# Patient Record
Sex: Female | Born: 1985 | Race: Black or African American | Hispanic: No | Marital: Single | Smoking: Never smoker
Health system: Southern US, Community
[De-identification: ages and names within clinical notes are randomized; demographics above are authoritative.]

## PROBLEM LIST (undated history)

## (undated) DIAGNOSIS — D649 Anemia, unspecified: Secondary | ICD-10-CM

## (undated) HISTORY — PX: LAPAROSCOPIC GASTRIC SLEEVE RESECTION: SHX5895

---

## 2005-05-19 ENCOUNTER — Other Ambulatory Visit: Admission: RE | Admit: 2005-05-19 | Discharge: 2005-05-19 | Payer: Self-pay | Admitting: Family Medicine

## 2005-05-19 ENCOUNTER — Ambulatory Visit: Payer: Self-pay | Admitting: Family Medicine

## 2006-01-12 ENCOUNTER — Ambulatory Visit: Payer: Self-pay | Admitting: Family Medicine

## 2007-05-08 ENCOUNTER — Emergency Department (HOSPITAL_COMMUNITY): Admission: EM | Admit: 2007-05-08 | Discharge: 2007-05-08 | Payer: Self-pay | Admitting: Emergency Medicine

## 2007-05-17 ENCOUNTER — Emergency Department (HOSPITAL_COMMUNITY): Admission: EM | Admit: 2007-05-17 | Discharge: 2007-05-18 | Payer: Self-pay | Admitting: Emergency Medicine

## 2007-05-20 ENCOUNTER — Inpatient Hospital Stay (HOSPITAL_COMMUNITY): Admission: AD | Admit: 2007-05-20 | Discharge: 2007-05-20 | Payer: Self-pay | Admitting: Obstetrics & Gynecology

## 2007-05-26 ENCOUNTER — Inpatient Hospital Stay (HOSPITAL_COMMUNITY): Admission: AD | Admit: 2007-05-26 | Discharge: 2007-05-26 | Payer: Self-pay | Admitting: Obstetrics and Gynecology

## 2007-09-11 ENCOUNTER — Emergency Department (HOSPITAL_COMMUNITY): Admission: EM | Admit: 2007-09-11 | Discharge: 2007-09-11 | Payer: Self-pay | Admitting: Emergency Medicine

## 2008-08-13 ENCOUNTER — Ambulatory Visit: Payer: Self-pay | Admitting: Internal Medicine

## 2008-09-17 ENCOUNTER — Ambulatory Visit: Payer: Self-pay | Admitting: *Deleted

## 2008-09-17 ENCOUNTER — Encounter (INDEPENDENT_AMBULATORY_CARE_PROVIDER_SITE_OTHER): Payer: Self-pay | Admitting: Family Medicine

## 2008-09-17 ENCOUNTER — Ambulatory Visit: Payer: Self-pay | Admitting: Family Medicine

## 2008-09-17 LAB — CONVERTED CEMR LAB
Chlamydia, DNA Probe: NEGATIVE
GC Probe Amp, Genital: NEGATIVE

## 2008-09-21 ENCOUNTER — Ambulatory Visit (HOSPITAL_COMMUNITY): Admission: RE | Admit: 2008-09-21 | Discharge: 2008-09-21 | Payer: Self-pay | Admitting: Family Medicine

## 2008-10-21 ENCOUNTER — Encounter: Admission: RE | Admit: 2008-10-21 | Discharge: 2008-11-17 | Payer: Self-pay | Admitting: Family Medicine

## 2008-11-23 ENCOUNTER — Encounter: Admission: RE | Admit: 2008-11-23 | Discharge: 2008-12-22 | Payer: Self-pay | Admitting: Family Medicine

## 2009-02-09 ENCOUNTER — Encounter: Admission: RE | Admit: 2009-02-09 | Discharge: 2009-04-01 | Payer: Self-pay | Admitting: Family Medicine

## 2009-02-10 ENCOUNTER — Emergency Department (HOSPITAL_COMMUNITY): Admission: EM | Admit: 2009-02-10 | Discharge: 2009-02-10 | Payer: Self-pay | Admitting: Emergency Medicine

## 2010-12-11 ENCOUNTER — Encounter: Payer: Self-pay | Admitting: Sports Medicine

## 2011-09-05 LAB — URINALYSIS, ROUTINE W REFLEX MICROSCOPIC
Glucose, UA: NEGATIVE
Protein, ur: 100 — AB
Specific Gravity, Urine: >1.03 — ABNORMAL HIGH
pH: 6

## 2011-09-06 LAB — URINALYSIS, ROUTINE W REFLEX MICROSCOPIC
Bilirubin Urine: NEGATIVE
Glucose, UA: 100 — AB
Glucose, UA: NEGATIVE
Glucose, UA: NEGATIVE
Ketones, ur: >80 — AB
Ketones, ur: NEGATIVE
Nitrite: NEGATIVE
Nitrite: NEGATIVE
Protein, ur: NEGATIVE
Specific Gravity, Urine: 1.025
Specific Gravity, Urine: 1.037 — ABNORMAL HIGH
Urobilinogen, UA: 1

## 2011-09-06 LAB — URINE MICROSCOPIC-ADD ON

## 2011-09-06 LAB — COMPREHENSIVE METABOLIC PANEL
ALT: 16
AST: 16
Alkaline Phosphatase: 47
CO2: 18 — ABNORMAL LOW
Calcium: 8.9
Creatinine, Ser: 0.67
GFR calc Af Amer: >60
GFR calc Af Amer: >60
GFR calc non Af Amer: >60
Glucose, Bld: 84
Glucose, Bld: 90
Sodium: 136
Sodium: 139
Total Bilirubin: 1.2
Total Protein: 6.6

## 2011-09-06 LAB — CBC
MCHC: 32.7
Platelets: 361
RDW: 17.2 — ABNORMAL HIGH
WBC: 4.2
WBC: 5.9

## 2011-09-06 LAB — DIFFERENTIAL
Basophils Absolute: 0
Basophils Relative: 0
Basophils Relative: 1
Eosinophils Absolute: 0
Eosinophils Absolute: 0
Lymphs Abs: 0.9
Monocytes Relative: 11
Monocytes Relative: 6

## 2011-09-06 LAB — GC/CHLAMYDIA PROBE AMP, GENITAL: GC Probe Amp, Genital: NEGATIVE

## 2011-09-06 LAB — POCT PREGNANCY, URINE
Operator id: 288831
Preg Test, Ur: POSITIVE

## 2011-09-06 LAB — WET PREP, GENITAL: Yeast Wet Prep HPF POC: NONE SEEN

## 2011-09-06 LAB — LIPASE, BLOOD: Lipase: 23

## 2011-09-06 LAB — HCG, QUANTITATIVE, PREGNANCY: hCG, Beta Chain, Quant, S: 17376 — ABNORMAL HIGH

## 2018-07-23 ENCOUNTER — Other Ambulatory Visit: Payer: Self-pay

## 2018-07-23 ENCOUNTER — Emergency Department (HOSPITAL_BASED_OUTPATIENT_CLINIC_OR_DEPARTMENT_OTHER)
Admission: EM | Admit: 2018-07-23 | Discharge: 2018-07-23 | Disposition: A | Discharge status: Home / Self Care | Payer: 59 | Attending: Emergency Medicine | Admitting: Emergency Medicine

## 2018-07-23 ENCOUNTER — Encounter (HOSPITAL_BASED_OUTPATIENT_CLINIC_OR_DEPARTMENT_OTHER): Payer: Self-pay

## 2018-07-23 DIAGNOSIS — Z3A01 Less than 8 weeks gestation of pregnancy: Secondary | ICD-10-CM | POA: Insufficient documentation

## 2018-07-23 DIAGNOSIS — O200 Threatened abortion: Secondary | ICD-10-CM

## 2018-07-23 DIAGNOSIS — O209 Hemorrhage in early pregnancy, unspecified: Secondary | ICD-10-CM | POA: Diagnosis present

## 2018-07-23 LAB — HCG, QUANTITATIVE, PREGNANCY: hCG, Beta Chain, Quant, S: 9161 m[IU]/mL — ABNORMAL HIGH (ref <5)

## 2018-07-23 NOTE — ED Notes (Signed)
Pt saw OBGYN Friday to comfirm pregnancy- pt reports having low but normal HCG count. Concerned for miscarriage. No hx of same.

## 2018-07-23 NOTE — ED Triage Notes (Addendum)
Pt states she is [redacted] weeks pregnant-vaginal bleeding with "clots and tissue" today-pad count 1 in place-NAD-steady gait

## 2018-07-23 NOTE — ED Provider Notes (Signed)
[facility] EMERGENCY DEPARTMENT Provider Note   CSN: [id number] Arrival date & time: 07/23/18  1219     History   Chief Complaint Chief Complaint  Patient presents with  . Threatened Miscarriage    HPI Michelle Glenn is a 32 y.o. female.  HPI   Blood, blood clot in tissue [facility] appt 9/17 6wk and 5 days by dates [place] was done Saturday at [place], intrauterine pregnancy Has had small amount of bleeding, had clot in tissue which was concerning. Since then no bleeding.  No abdominal pain. No lightheadedness  G3P2002 O POS   History reviewed. No pertinent past medical history.  There are no active problems to display for this patient.   History reviewed. No pertinent surgical history.   OB History    Gravida  1   Para      Term      Preterm      AB      Living        SAB      TAB      Ectopic      Multiple      Live Births               Home Medications    Prior to Admission medications   Not on File    Family History No family history on file.  Social History Social History   Tobacco Use  . Smoking status: Never Smoker  . Smokeless tobacco: Never Used  Substance Use Topics  . Alcohol use: Never    Frequency: Never  . Drug use: Never     Allergies   Patient has no known allergies.   Review of Systems Review of Systems  Constitutional: Negative for fever.  HENT: Negative for sore throat.   Eyes: Negative for visual disturbance.  Respiratory: Negative for cough and shortness of breath.   Cardiovascular: Negative for chest pain.  Gastrointestinal: Positive for nausea. Negative for abdominal pain and vomiting.  Genitourinary: Positive for vaginal bleeding. Negative for difficulty urinating.  Musculoskeletal: Negative for back pain and neck pain.  Skin: Negative for rash.  Neurological: Negative for syncope and headaches.     Physical Exam Updated Vital Signs BP 122/85 (BP Location: Left Arm)    Pulse 87   Temp 98.4 F (36.9 C) (Oral)   Resp 18   Ht 5\' 9"  (1.753 m)   Wt 111.6 kg   SpO2 100%   BMI 36.33 kg/m   Physical Exam  Constitutional: She is oriented to person, place, and time. She appears well-developed and well-nourished. No distress.  HENT:  Head: Normocephalic and atraumatic.  Eyes: Conjunctivae and EOM are normal.  Neck: Normal range of motion.  Cardiovascular: Normal rate.  Pulmonary/Chest: Effort normal and breath sounds normal. No respiratory distress.  Musculoskeletal: She exhibits no edema or tenderness.  Neurological: She is alert and oriented to person, place, and time.  Skin: Skin is warm and dry. She is not diaphoretic.  Nursing note and vitals reviewed.    ED Treatments / Results  Labs (all labs ordered are listed, but only abnormal results are displayed) Labs Reviewed  HCG, QUANTITATIVE, PREGNANCY - Abnormal; Notable for the following components:      Result Value   hCG, Beta Chain, Quant, S 9,161 (*)    All other components within normal limits    EKG None  Radiology No results found.  Procedures Procedures (including critical care time)  Medications Ordered  in ED Medications - No data to display   Initial Impression / Assessment and Plan / ED Course  I have reviewed the triage vital signs and the nursing notes.  Pertinent labs & imaging results that were available during my care of the patient were reviewed by me and considered in my medical decision making (see chart for details).     32yo female G3P2002 at 6wk 5 days by early [place] who presents with episode of vaginal bleeding.  Had US done at [facility] confirming intrauterine pregnancy less than one week ago.  Blood type OPOS in our system. No significant hemorrhage by history, no longer bleeding, do not feel CBC or pelvic exam indicated at this time.  Emergent TVUS not indicated as it will not change management at this time, and discussed that I recommend continued monitoring of  symptoms and outpatient [place] through [facility] for reevaluation. Recommend pelvic rest.  Discussed reasons to return.  Final Clinical Impressions(s) / ED Diagnoses   Final diagnoses:  Threatened miscarriage    ED Discharge Orders    None       Alvira Monday, MD 07/23/18 2133

## 2021-04-19 ENCOUNTER — Emergency Department
Admission: RE | Admit: 2021-04-19 | Discharge: 2021-04-19 | Disposition: A | Discharge status: Home / Self Care | Payer: Medicaid Other | Source: Ambulatory Visit

## 2021-04-19 ENCOUNTER — Other Ambulatory Visit: Payer: Self-pay

## 2021-04-19 VITALS — BP 111/80 | HR 113 | Temp 99.0°F | Resp 18 | Ht 69.0 in | Wt 169.0 lb

## 2021-04-19 DIAGNOSIS — R059 Cough, unspecified: Secondary | ICD-10-CM | POA: Diagnosis not present

## 2021-04-19 DIAGNOSIS — U071 COVID-19: Secondary | ICD-10-CM

## 2021-04-19 HISTORY — DX: Anemia, unspecified: D64.9

## 2021-04-19 NOTE — ED Provider Notes (Signed)
[facility]    CSN: [id number] Arrival date & time: 04/19/21  0954      History   Chief Complaint Chief Complaint  Patient presents with  . Headache  . Sore Throat  . Nasal Congestion    HPI Michelle Glenn is a 35 y.o. female.   The history is provided by the patient. No language interpreter was used.  Headache Pain location:  Generalized Associated symptoms: cough   Sore Throat Associated symptoms include headaches.  Cough Cough characteristics:  Non-productive Sputum characteristics:  Nondescript Duration:  1 day Timing:  Constant Progression:  Worsening Chronicity:  New Associated symptoms: headaches   Pt had a positive home covid test   Past Medical History:  Diagnosis Date  . Anemia     There are no problems to display for this patient.   Past Surgical History:  Procedure Laterality Date  . CESAREAN SECTION      OB History    Gravida  1   Para      Term      Preterm      AB      Living        SAB      IAB      Ectopic      Multiple      Live Births               Home Medications    Prior to Admission medications   Medication Sig Start Date End Date Taking? Authorizing Provider  topiramate (TOPAMAX) 25 MG tablet TAKE 1 TABLET (25MG ) DAILY AROUND 5PM, NO ALCOHOL WHILE TAKING 03/23/21  Yes [provider]  phentermine (ADIPEX-P) 37.5 MG tablet Take 37.5 mg by mouth every morning. 03/21/21   [provider]    Family History Family History  Problem Relation Age of Onset  . Healthy Mother   . Diabetes Father     Social History Social History   Tobacco Use  . Smoking status: Never Smoker  . Smokeless tobacco: Never Used  Vaping Use  . Vaping Use: Never used  Substance Use Topics  . Alcohol use: Yes    Comment: rarely  . Drug use: Never     Allergies   Patient has no known allergies.   Review of Systems Review of Systems  Respiratory: Positive for cough.   Neurological:  Positive for headaches.  All other systems reviewed and are negative.    Physical Exam Triage Vital Signs ED Triage Vitals  Enc Vitals Group     BP 04/19/21 1017 111/80     Pulse Rate 04/19/21 1017 (!) 113     Resp 04/19/21 1017 18     Temp 04/19/21 1017 99 F (37.2 C)     Temp src --      SpO2 04/19/21 1017 99 %     Weight 04/19/21 1011 169 lb (76.7 kg)     Height 04/19/21 1011 5\' 9"  (1.753 m)     Head Circumference --      Peak Flow --      Pain Score 04/19/21 1011 8     Pain Loc --      Pain Edu? --      Excl. in GC? --    No data found.  Updated Vital Signs BP 111/80 (BP Location: Right Arm)   Pulse (!) 113   Temp 99 F (37.2 C)   Resp 18   Ht 5\' 9"  (1.753 m)  Wt 76.7 kg   LMP 04/06/2021   SpO2 99%   Breastfeeding Unknown   BMI 24.96 kg/m   Visual Acuity Right Eye Distance:   Left Eye Distance:   Bilateral Distance:    Right Eye Near:   Left Eye Near:    Bilateral Near:     Physical Exam Vitals reviewed.  Constitutional:      Appearance: She is well-developed.  Cardiovascular:     Rate and Rhythm: Normal rate and regular rhythm.  Pulmonary:     Effort: Pulmonary effort is normal.     Breath sounds: Normal breath sounds.  Abdominal:     Palpations: Abdomen is soft.  Neurological:     Mental Status: She is alert.      UC Treatments / Results  Labs (all labs ordered are listed, but only abnormal results are displayed) Labs Reviewed  COVID-19, FLU A+B NAA    EKG   Radiology No results found.  Procedures Procedures (including critical care time)  Medications Ordered in UC Medications - No data to display  Initial Impression / Assessment and Plan / UC Course  I have reviewed the triage vital signs and the nursing notes.  Pertinent labs & imaging results that were available during my care of the patient were reviewed by me and considered in my medical decision making (see chart for details).     MDM:  covid ordered.  Pt given  note for out of work  Final Clinical Impressions(s) / UC Diagnoses   Final diagnoses:  Cough  COVID     Discharge Instructions     Tylenol every 4 hours    ED Prescriptions    None     PDMP not reviewed this encounter.  An After Visit Summary was printed and given to the patient.    Elson Areas, [place] 04/19/21 1054

## 2021-04-19 NOTE — Discharge Instructions (Addendum)
Tylenol every 4 hours  

## 2021-04-19 NOTE — ED Triage Notes (Signed)
Pt presents to Urgent Care with c/o headache, sore throat, nasal congestion, fatigue, and intermittent dizziness and sob since yesterday. Had a positive home COVID test today. Pt is vaccinated.

## 2021-04-21 LAB — COVID-19, FLU A+B NAA
Influenza A, NAA: NOT DETECTED
Influenza B, NAA: NOT DETECTED
SARS-CoV-2, NAA: DETECTED — AB

## 2021-04-21 LAB — SPECIMEN STATUS REPORT

## 2021-09-27 ENCOUNTER — Emergency Department
Admission: EM | Admit: 2021-09-27 | Discharge: 2021-09-27 | Disposition: A | Discharge status: Home / Self Care | Payer: Medicaid Other | Source: Home / Self Care | Attending: Family Medicine | Admitting: Family Medicine

## 2021-09-27 ENCOUNTER — Emergency Department: Admit: 2021-09-27 | Discharge status: Absent without leave | Payer: Self-pay

## 2021-09-27 ENCOUNTER — Other Ambulatory Visit: Payer: Self-pay

## 2021-09-27 DIAGNOSIS — S39012A Strain of muscle, fascia and tendon of lower back, initial encounter: Secondary | ICD-10-CM | POA: Diagnosis not present

## 2021-09-27 LAB — POCT URINALYSIS DIP (MANUAL ENTRY)
Blood, UA: NEGATIVE
Glucose, UA: NEGATIVE mg/dL
Leukocytes, UA: NEGATIVE
Nitrite, UA: NEGATIVE
Protein Ur, POC: 30 mg/dL — AB
Spec Grav, UA: >=1.03 — AB (ref 1.010–1.025)
Urobilinogen, UA: 0.2 E.U./dL
pH, UA: 6 (ref 5.0–8.0)

## 2021-09-27 MED ORDER — KETOROLAC TROMETHAMINE 60 MG/2ML IM SOLN
60.0000 mg | Freq: Once | INTRAMUSCULAR | Status: AC
  Administered 2021-09-27: 60 mg via INTRAMUSCULAR

## 2021-09-27 MED ORDER — PREDNISONE 10 MG (21) PO TBPK: ORAL_TABLET | Freq: Every day | ORAL | 0 refills | Status: AC

## 2021-09-27 MED ORDER — HYDROCODONE-ACETAMINOPHEN 5-325 MG PO TABS: 1.0000 | ORAL_TABLET | Freq: Four times a day (QID) | ORAL | 0 refills | Status: AC | PRN

## 2021-09-27 NOTE — Discharge Instructions (Signed)
Take the prednisone as directed.  Take all of day 1 today.  Take 3 pills now and 3 pills at bedtime.  Try to take with some food (even just a couple spoonfuls of yogurt) Take the pain medicine as needed.  This also should be taken with some food.  Do not drive on pain medicine. Use ice or heat to area Avoid activities or movements that increase pain See your doctor if not improving towards the end of the week

## 2021-09-27 NOTE — ED Triage Notes (Signed)
Pt presents to Urgent Care with c/o lower back pain since yesterday. Does not recall injury and has no other s/s.

## 2021-09-27 NOTE — ED Provider Notes (Signed)
[facility]    CSN: [id number] Arrival date & time: 09/27/21  1611      History   Chief Complaint Chief Complaint  Patient presents with   Back Pain    HPI Michelle Glenn is a 35 y.o. female.   HPI  Patient has central low back pain.  Its at the L5-S1 junction.  It is started yesterday.  No accident.  No injury.  No overuse.  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  Has never had back problems before.  She states that it hurts all of the time moderately and then when she removes or randomly she will get sudden stabbing pains.  It makes her legs feel like they are going to buckle. 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  Wonders what medicine she can take  Past Medical History:  Diagnosis Date   Anemia     There are no problems to display for this patient.   Past Surgical History:  Procedure Laterality Date   CESAREAN SECTION     LAPAROSCOPIC GASTRIC SLEEVE RESECTION      OB History     Gravida  1   Para      Term      Preterm      AB      Living         SAB      IAB      Ectopic      Multiple      Live Births               Home Medications    Prior to Admission medications   Medication Sig Start Date End Date Taking? Authorizing Provider  HYDROcodone-acetaminophen (NORCO/VICODIN) 5-325 MG tablet Take 1-2 tablets by mouth every 6 (six) hours as needed. 09/27/21  Yes Eustace Moore, MD  omeprazole (PRILOSEC) 40 MG capsule Take by mouth. 08/18/21  Yes [provider]  predniSONE (STERAPRED UNI-PAK 21 TAB) 10 MG (21) TBPK tablet Take by mouth daily. tad 09/27/21  Yes Eustace Moore, MD    Family History Family History  Problem Relation Age of Onset   Healthy Mother    Diabetes Father     Social History Social History   Tobacco Use   Smoking status: Never   Smokeless tobacco: Never  Vaping Use   Vaping Use: Never used  Substance Use Topics   Alcohol use: Yes    Comment: rarely   Drug  use: Never     Allergies   Patient has no known allergies.   Review of Systems Review of Systems See HPI  Physical Exam Triage Vital Signs ED Triage Vitals  Enc Vitals Group     BP 09/27/21 1633 113/84     Pulse Rate 09/27/21 1633 100     Resp 09/27/21 1633 20     Temp 09/27/21 1633 98.2 F (36.8 C)     Temp Source 09/27/21 1633 Oral     SpO2 09/27/21 1633 98 %     Weight 09/27/21 1630 232 lb (105.2 kg)     Height 09/27/21 1630 5\' 9"  (1.753 m)     Head Circumference --      Peak Flow --      Pain Score 09/27/21 1630 8     Pain Loc --      Pain Edu? --      Excl. in GC? --    No data found.  Updated Vital Signs BP 113/84 (BP Location: Right Arm)   Pulse 100   Temp 98.2 F (36.8 C) (Oral)   Resp 20   Ht 5\' 9"  (1.753 m)   Wt 105.2 kg   LMP 09/19/2021   SpO2 98%   BMI 34.26 kg/m       Physical Exam Constitutional:      General: She is in acute distress.     Appearance: She is well-developed. She is obese.  HENT:     Head: Normocephalic and atraumatic.     Mouth/Throat:     Comments: Mask is in place Eyes:     Conjunctiva/sclera: Conjunctivae normal.     Pupils: Pupils are equal, round, and reactive to light.  Cardiovascular:     Rate and Rhythm: Normal rate.  Pulmonary:     Effort: Pulmonary effort is normal. No respiratory distress.  Abdominal:     General: There is no distension.     Palpations: Abdomen is soft.  Musculoskeletal:        General: Normal range of motion.     Cervical back: Normal range of motion.     Comments: Guarded movements.  Tender at the L5S1 jct.  Mild tenderness of the lumbar muscles  Skin:    General: Skin is warm and dry.  Neurological:     General: No focal deficit present.     Mental Status: She is alert.     Sensory: No sensory deficit.     Motor: No weakness.     Gait: Gait normal.     Deep Tendon Reflexes: Reflexes normal.  Psychiatric:        Mood and Affect: Mood normal.        Behavior: Behavior normal.      UC Treatments / Results  Labs (all labs ordered are listed, but only abnormal results are displayed) Labs Reviewed  POCT URINALYSIS DIP (MANUAL ENTRY) - Abnormal; Notable for the following components:      Result Value   Color, UA straw (*)    Bilirubin, UA moderate (*)    Ketones, POC UA large (80) (*)    Spec Grav, UA >=1.030 (*)    Protein Ur, POC =30 (*)    All other components within normal limits    EKG   Radiology No results found.  Procedures Procedures (including critical care time)  Medications Ordered in UC Medications  ketorolac (TORADOL) injection 60 mg (60 mg Intramuscular Given 09/27/21 1712)    Initial Impression / Assessment and Plan / UC Course  I have reviewed the triage vital signs and the nursing notes.  Pertinent labs & imaging results that were available during my care of the patient were reviewed by me and considered in my medical decision making (see chart for details).      Final Clinical Impressions(s) / UC Diagnoses   Final diagnoses:  Strain of lumbar region, initial encounter     Discharge Instructions      Take the prednisone as directed.  Take all of day 1 today.  Take 3 pills now and 3 pills at bedtime.  Try to take with some food (even just a couple spoonfuls of yogurt) Take the pain medicine as needed.  This also should be taken with some food.  Do not drive on pain medicine. Use ice or heat to area Avoid activities or movements that increase pain See your doctor if not improving towards the end of the week   ED Prescriptions  Medication Sig Dispense Auth. Provider   predniSONE (STERAPRED UNI-PAK 21 TAB) 10 MG (21) TBPK tablet Take by mouth daily. tad 21 tablet Eustace Moore, MD   HYDROcodone-acetaminophen (NORCO/VICODIN) 5-325 MG tablet Take 1-2 tablets by mouth every 6 (six) hours as needed. 15 tablet Eustace Moore, MD      I have reviewed the PDMP during this encounter.   Eustace Moore,  MD 09/29/21 [phone]
